# Patient Record
Sex: Female | Born: 1965 | Race: Black or African American | Hispanic: No | Marital: Married | Smoking: Current every day smoker
Health system: Southern US, Community
[De-identification: ages and names within clinical notes are randomized; demographics above are authoritative.]

## PROBLEM LIST (undated history)

## (undated) DIAGNOSIS — E785 Hyperlipidemia, unspecified: Secondary | ICD-10-CM

## (undated) DIAGNOSIS — I10 Essential (primary) hypertension: Secondary | ICD-10-CM

## (undated) DIAGNOSIS — N926 Irregular menstruation, unspecified: Secondary | ICD-10-CM

## (undated) DIAGNOSIS — K635 Polyp of colon: Secondary | ICD-10-CM

## (undated) DIAGNOSIS — O00109 Unspecified tubal pregnancy without intrauterine pregnancy: Secondary | ICD-10-CM

## (undated) DIAGNOSIS — M722 Plantar fascial fibromatosis: Secondary | ICD-10-CM

## (undated) DIAGNOSIS — E282 Polycystic ovarian syndrome: Secondary | ICD-10-CM

## (undated) HISTORY — DX: Irregular menstruation, unspecified: N92.6

## (undated) HISTORY — DX: Polycystic ovarian syndrome: E28.2

## (undated) HISTORY — DX: Polyp of colon: K63.5

## (undated) HISTORY — DX: Morbid (severe) obesity due to excess calories: E66.01

---

## 2009-04-03 LAB — HM COLONOSCOPY

## 2011-12-30 ENCOUNTER — Ambulatory Visit (INDEPENDENT_AMBULATORY_CARE_PROVIDER_SITE_OTHER): Payer: Self-pay | Admitting: Physician Assistant

## 2011-12-30 ENCOUNTER — Encounter: Payer: Self-pay | Admitting: Physician Assistant

## 2011-12-30 VITALS — BP 153/77 | HR 88 | Temp 97.9°F | Ht 66.0 in | Wt 290.0 lb

## 2011-12-30 DIAGNOSIS — I10 Essential (primary) hypertension: Secondary | ICD-10-CM

## 2011-12-30 DIAGNOSIS — J40 Bronchitis, not specified as acute or chronic: Secondary | ICD-10-CM

## 2011-12-30 MED ORDER — AZITHROMYCIN 250 MG PO TABS: ORAL_TABLET | ORAL | Status: DC

## 2011-12-30 MED ORDER — ALBUTEROL SULFATE HFA 108 (90 BASE) MCG/ACT IN AERS: 2.0000 | INHALATION_SPRAY | Freq: Four times a day (QID) | RESPIRATORY_TRACT | Status: DC | PRN

## 2011-12-30 MED ORDER — HYDROCODONE-HOMATROPINE 5-1.5 MG/5ML PO SYRP: 5.0000 mL | ORAL_SOLUTION | Freq: Every evening | ORAL | Status: DC | PRN

## 2011-12-30 NOTE — Patient Instructions (Addendum)
[facility].   Continue to monitor BP and if doesnot go down then need to follow up with Dr. Alberteen Sam.   Acute Bronchitis You have acute bronchitis. This means you have a chest cold. The airways in your lungs are red and sore (inflamed). Acute means it is sudden onset.  CAUSES Bronchitis is most often caused by the same virus that causes a cold. SYMPTOMS   Body aches.  Chest congestion.  Chills.  Cough.  Fever.  Shortness of breath.  Sore throat. TREATMENT  Acute bronchitis is usually treated with rest, fluids, and medicines for relief of fever or cough. Most symptoms should go away after a few days or a week. Increased fluids may help thin your secretions and will prevent dehydration. Your caregiver may give you an inhaler to improve your symptoms. The inhaler reduces shortness of breath and helps control cough. You can take over-the-counter pain relievers or cough medicine to decrease coughing, pain, or fever. A cool-air vaporizer may help thin bronchial secretions and make it easier to clear your chest. Antibiotics are usually not needed but can be prescribed if you smoke, are seriously ill, have chronic lung problems, are elderly, or you are at higher risk for developing complications.Allergies and asthma can make bronchitis worse. Repeated episodes of bronchitis may cause longstanding lung problems. Avoid smoking and secondhand smoke.Exposure to cigarette smoke or irritating chemicals will make bronchitis worse. If you are a cigarette smoker, consider using nicotine gum or skin patches to help control withdrawal symptoms. Quitting smoking will help your lungs heal faster. Recovery from bronchitis is often slow, but you should start feeling better after 2 to 3 days. Cough from bronchitis frequently lasts for 3 to 4 weeks. To prevent another bout of acute bronchitis:  Quit smoking.  Wash your hands frequently to get rid of viruses or use a hand sanitizer.  Avoid other people  with cold or virus symptoms.  Try not to touch your hands to your mouth, nose, or eyes. SEEK IMMEDIATE MEDICAL CARE IF:  You develop increased fever, chills, or chest pain.  You have severe shortness of breath or bloody sputum.  You develop dehydration, fainting, repeated vomiting, or a severe headache.  You have no improvement after 1 week of treatment or you get worse. MAKE SURE YOU:   Understand these instructions.  Will watch your condition.  Will get help right away if you are not doing well or get worse. Document Released: 01/28/2004 Document Revised: 03/14/2011 Document Reviewed: 04/14/2010 [facility] Patient Information 2013 [place], [place].

## 2011-12-30 NOTE — Progress Notes (Signed)
  Subjective:    Patient ID: Mindy Wallace, female    DOB: 02/23/1965, 46 y.o.   MRN: [id number]  HPI Patient reports to the clinic as a pt of Dr. Alberteen Sam who is on vacation. She has had 4 weeks of ongoing cough and SOB. She denies any fever, muscle aches, sore throat, or ear pain. She does continue to feel weak and feels more and more SOB. She has HTN and has to be careful about what she takes OTC. She has tried a safe decongestant, benadryl first and did improve:however came back. She then tried [facility] and mucinex DM. She now feels like her chest is very tight and hears herself wheezing. She does not have an inhaler.she has now started to have chills. Cough is productive.   Review of Systems     Objective:   Physical Exam  Constitutional: She is oriented to person, place, and time. She appears well-developed and well-nourished.  HENT:  Head: Normocephalic and atraumatic.  Right Ear: External ear normal.  Left Ear: External ear normal.  Mouth/Throat: Oropharynx is clear and moist.       TM's clear bilaterally.  No sinus tenderness to palpation.  Eyes: Conjunctivae normal are normal.       Clear watery discharge from both eyes.  Neck: Normal range of motion. Neck supple.  Cardiovascular: Normal rate, regular rhythm and normal heart sounds.   Pulmonary/Chest: Effort normal. She has no wheezes.       NO wheezing heard. Does sound like air movement is decreased.  Lymphadenopathy:    She has no cervical adenopathy.  Neurological: She is alert and oriented to person, place, and time.  Skin: Skin is warm and dry.  Psychiatric: She has a normal mood and affect. Her behavior is normal.          Assessment & Plan:  Bronchitis-Gave zpak to take for the next 5 days. Start using albuterol twice a day for next couple of days. Hycodan given for cough at night. Call office if not improving in next couple of days may need to add prednisone burst. Continue with mucinex and [facility] cold  care. Stay hydrated. If having to use albuterol on a regular basis then need to be seen again to discuss futher treatment.   HTN- discussed withpt importance of BP controll. Continue to monitor if not going back down then need to follow up with Dr. Alberteen Sam. ABP should go down after stop coughing.

## 2012-03-28 ENCOUNTER — Other Ambulatory Visit: Payer: Self-pay | Admitting: Family Medicine

## 2012-07-30 ENCOUNTER — Emergency Department (HOSPITAL_BASED_OUTPATIENT_CLINIC_OR_DEPARTMENT_OTHER): Payer: BC Managed Care – PPO

## 2012-07-30 ENCOUNTER — Encounter (HOSPITAL_BASED_OUTPATIENT_CLINIC_OR_DEPARTMENT_OTHER): Payer: Self-pay | Admitting: *Deleted

## 2012-07-30 ENCOUNTER — Emergency Department (HOSPITAL_BASED_OUTPATIENT_CLINIC_OR_DEPARTMENT_OTHER)
Admission: EM | Admit: 2012-07-30 | Discharge: 2012-07-30 | Disposition: A | Discharge status: Home / Self Care | Payer: BC Managed Care – PPO | Attending: Emergency Medicine | Admitting: Emergency Medicine

## 2012-07-30 DIAGNOSIS — Z860100 Personal history of colon polyps, unspecified: Secondary | ICD-10-CM | POA: Insufficient documentation

## 2012-07-30 DIAGNOSIS — F17200 Nicotine dependence, unspecified, uncomplicated: Secondary | ICD-10-CM | POA: Insufficient documentation

## 2012-07-30 DIAGNOSIS — E785 Hyperlipidemia, unspecified: Secondary | ICD-10-CM | POA: Insufficient documentation

## 2012-07-30 DIAGNOSIS — Z8739 Personal history of other diseases of the musculoskeletal system and connective tissue: Secondary | ICD-10-CM | POA: Insufficient documentation

## 2012-07-30 DIAGNOSIS — Z79899 Other long term (current) drug therapy: Secondary | ICD-10-CM | POA: Insufficient documentation

## 2012-07-30 DIAGNOSIS — Z8742 Personal history of other diseases of the female genital tract: Secondary | ICD-10-CM | POA: Insufficient documentation

## 2012-07-30 DIAGNOSIS — I10 Essential (primary) hypertension: Secondary | ICD-10-CM | POA: Insufficient documentation

## 2012-07-30 HISTORY — DX: Hyperlipidemia, unspecified: E78.5

## 2012-07-30 HISTORY — DX: Plantar fascial fibromatosis: M72.2

## 2012-07-30 HISTORY — DX: Unspecified tubal pregnancy without intrauterine pregnancy: O00.109

## 2012-07-30 HISTORY — DX: Essential (primary) hypertension: I10

## 2012-07-30 LAB — BASIC METABOLIC PANEL
GFR calc Af Amer: >90 mL/min (ref >90)
GFR calc non Af Amer: 85 mL/min — ABNORMAL LOW (ref >90)
Potassium: 3.6 mEq/L (ref 3.5–5.1)
Sodium: 142 mEq/L (ref 135–145)

## 2012-07-30 LAB — CBC
MCHC: 32.7 g/dL (ref 30.0–36.0)
Platelets: 216 10*3/uL (ref 150–400)
RDW: 16.6 % — ABNORMAL HIGH (ref 11.5–15.5)

## 2012-07-30 NOTE — ED Notes (Signed)
Pt reports numbness to left hand x 1 month. States she has noticed it is worse after drinking sparkling water or eating certain foods. No arm drift, equal grips, no facial droop or slurred speech. Denies any neck/back injury. States had hand surgery years ago.

## 2012-07-30 NOTE — ED Notes (Signed)
Patient transported to CT 

## 2012-07-30 NOTE — ED Notes (Addendum)
Pt c/o left arm " tingling" x 1 month , seen by PMD for same

## 2012-07-30 NOTE — ED Provider Notes (Signed)
CSN: [id number]     Arrival date & time 07/30/12  1803 History  This chart was scribed for Mindy Chick, MD by Bennett Scrape, ED Scribe. This patient was seen in room MH08/MH08 and the patient's care was started at 7:20 PM.   First MD Initiated Contact with Patient 07/30/12 1905     Chief Complaint  Patient presents with  . Numbness    Patient is a 47 y.o. female presenting with general illness. The history is provided by the patient. No language interpreter was used.  Illness Location:  Left arm Quality:  Numbness Duration:  5 weeks Timing:  Intermittent Chronicity:  New Associated symptoms: no headaches   Risk factors:  None   HPI Comments: Ashlynn Gunnels is a 47 y.o. female who presents to the Emergency Department complaining of 4 to 5 weeks of intermittent paresthesias described as needles in the left hand that radiates up into the left shoulder. The symptoms originally started in the left forearm but now begin gradually in all 5 left fingers and gradually radiate up into the left shoulder.  She denies having the numbness currently. She denies having any known modifying factors or triggers. She reports that she called her PCP to schedule an appointment and was told to come to the ED for evaluation. She reports prior surgery in the left hand from finger fractures 20 years ago but denies having on-going complications. She denies any neck pain or prior neck injuries. She denies HA, visual disturbance and weakness as associated symptoms. She has a h/o HTN but denies any h/o DM.  She is currently on Coreg for "heart problems".  PCP is Dr. Alberteen Sam   Past Medical History  Diagnosis Date  . Hypertension   . Hyperlipemia   . Tubal pregnancy   . Plantar fasciitis   . Irregular periods   . PCOS (polycystic ovarian syndrome)   . Colon polyps    History reviewed. No pertinent past surgical history.  Family History  Problem Relation Age of Onset  . Diabetes Mother   .  Hypertension Mother   . Diabetes Father   . Cancer Paternal Aunt     Breast Cancer  . Diabetes Maternal Grandmother   . Diabetes Paternal Grandmother   . Alzheimer's disease Paternal Grandmother   . Cancer Paternal Aunt     Breast Cancer    History  Substance Use Topics  . Smoking status: Current Every Day Smoker -- 0.30 packs/day    Types: Cigarettes  . Smokeless tobacco: Not on file  . Alcohol Use: Yes   No OB history provided.  Review of Systems  HENT: Negative for neck pain.   Eyes: Negative for visual disturbance.  Neurological: Positive for numbness. Negative for weakness and headaches.  All other systems reviewed and are negative.    Allergies  Review of patient's allergies indicates no known allergies.  Home Medications   Current Outpatient Rx  Name  Route  Sig  Dispense  Refill  . albuterol (PROVENTIL HFA;VENTOLIN HFA) 108 (90 BASE) MCG/ACT inhaler   Inhalation   Inhale 2 puffs into the lungs every 6 (six) hours as needed for wheezing.   1 Inhaler   1   . amLODipine-valsartan (EXFORGE) 10-320 MG per tablet   Oral   Take 1 tablet by mouth daily.         . carvedilol (COREG) 12.5 MG tablet      TAKE 1 TABLET BY MOUTH TWICE DAILY   90 tablet  0     Please inform pt she was only given 30 days.  She  ...   . Cholecalciferol (VITAMIN D) 2000 UNITS CAPS   Oral   Take by mouth.         . fluconazole (DIFLUCAN) 150 MG tablet   Oral   Take 150 mg by mouth once.         . furosemide (LASIX) 40 MG tablet   Oral   Take 40 mg by mouth daily.         . naproxen sodium (ANAPROX) 550 MG tablet   Oral   Take 550 mg by mouth 2 (two) times daily with a meal.         . simvastatin (ZOCOR) 20 MG tablet   Oral   Take 20 mg by mouth every evening.          Triage Vitals: BP 133/76  Pulse 83  Temp(Src) 98.5 F (36.9 C) (Oral)  Resp 16  Ht 5\' 7"  (1.702 m)  Wt 297 lb (134.718 kg)  BMI 46.51 kg/m2  SpO2 97%  Physical Exam  Nursing note  and vitals reviewed. Constitutional: She is oriented to person, place, and time. She appears well-developed and well-nourished. No distress.  HENT:  Head: Normocephalic and atraumatic.  Eyes: Conjunctivae and EOM are normal. Pupils are equal, round, and reactive to light.  Neck: Normal range of motion. Neck supple. No tracheal deviation present.  Cardiovascular: Normal rate, regular rhythm and normal heart sounds.   No murmur heard. Pulmonary/Chest: Effort normal and breath sounds normal. No respiratory distress. She has no wheezes. She has no rales.  Abdominal: Soft. Bowel sounds are normal. There is no tenderness.  Musculoskeletal: Normal range of motion.  Neurological: She is alert and oriented to person, place, and time. No cranial nerve deficit.  Sensation and strength are intact throughout, CN 2 through 12 intact, radial, ulnar and median nerve testing of LUE are normal  Skin: Skin is warm and dry.  Psychiatric: She has a normal mood and affect. Her behavior is normal.    ED Course   Procedures (including critical care time)  DIAGNOSTIC STUDIES: Oxygen Saturation is 97% on room air, normal by my interpretation.    COORDINATION OF CARE: 7:22 PM-Advised pt that her symptoms are mostly likely not TIA or CVA related. Discussed treatment plan which includes CT of head, EKG, CBC panel and BMP with pt at bedside and pt agreed to plan.    Date: 07/30/2012  Rate: 65  Rhythm: normal sinus rhythm  QRS Axis: normal  Intervals: normal  ST/T Wave abnormalities: normal  Conduction Disutrbances: none  Narrative Interpretation: unremarkable     Labs Reviewed  CBC - Abnormal; Notable for the following:    MCV 73.0 (*)    MCH 23.9 (*)    RDW 16.6 (*)    All other components within normal limits  BASIC METABOLIC PANEL - Abnormal; Notable for the following:    Glucose, Bld 103 (*)    GFR calc non Af Amer 85 (*)    All other components within normal limits   Ct Head Wo  Contrast  07/30/2012   *RADIOLOGY REPORT*  Clinical Data: Intermittent left arm numbness.  CT HEAD WITHOUT CONTRAST  Technique:  Contiguous axial images were obtained from the base of the skull through the vertex without contrast.  Comparison: None.  Findings: The brain demonstrates no evidence of hemorrhage, infarction, edema, mass effect, extra-axial fluid collection, hydrocephalus or mass  lesion.  The skull is unremarkable.  IMPRESSION: Normal head CT.   Original Report Authenticated By: Irish Lack, M.D.   1. parasthesias  MDM  Pt presents with c/o intermittent numbness and tingling of left fingers and arm.  She states symptoms have been ongoing for several weeks. Normal neuro exam in the ED today.  She denies current symptoms.  I doubt this represents CVA, especially with normal CT after symptoms for weeks.  Also intermittent nature seems less likely.  Labs reassuring. Pt was advised f/u with her PMD as well as given follow up info for [facility].  Discharged with strict return precautions.  Pt agreeable with plan.  I personally performed the services described in this documentation, which was scribed in my presence. The recorded information has been reviewed and is accurate.    Mindy Chick, MD 07/31/12 1515

## 2012-07-31 ENCOUNTER — Ambulatory Visit: Payer: Self-pay | Admitting: Family Medicine

## 2012-08-14 ENCOUNTER — Encounter: Payer: Self-pay | Admitting: Family Medicine

## 2012-08-14 ENCOUNTER — Ambulatory Visit (INDEPENDENT_AMBULATORY_CARE_PROVIDER_SITE_OTHER): Payer: BC Managed Care – PPO | Admitting: Family Medicine

## 2012-08-14 VITALS — BP 130/90 | HR 71 | Resp 16 | Ht 65.0 in | Wt 299.0 lb

## 2012-08-14 DIAGNOSIS — R5381 Other malaise: Secondary | ICD-10-CM

## 2012-08-14 DIAGNOSIS — R232 Flushing: Secondary | ICD-10-CM

## 2012-08-14 DIAGNOSIS — I10 Essential (primary) hypertension: Secondary | ICD-10-CM

## 2012-08-14 DIAGNOSIS — M7989 Other specified soft tissue disorders: Secondary | ICD-10-CM

## 2012-08-14 DIAGNOSIS — E785 Hyperlipidemia, unspecified: Secondary | ICD-10-CM

## 2012-08-14 DIAGNOSIS — Z5181 Encounter for therapeutic drug level monitoring: Secondary | ICD-10-CM

## 2012-08-14 DIAGNOSIS — R6882 Decreased libido: Secondary | ICD-10-CM

## 2012-08-14 DIAGNOSIS — R5383 Other fatigue: Secondary | ICD-10-CM

## 2012-08-14 LAB — LIPID PANEL
Cholesterol: 165 mg/dL (ref 0–200)
HDL: 42 mg/dL (ref >39)
LDL Cholesterol: 103 mg/dL — ABNORMAL HIGH (ref 0–99)
Total CHOL/HDL Ratio: 3.9 Ratio
Triglycerides: 99 mg/dL (ref <150)
VLDL: 20 mg/dL (ref 0–40)

## 2012-08-14 LAB — HEPATIC FUNCTION PANEL
ALT: 20 U/L (ref 0–35)
AST: 18 U/L (ref 0–37)
Albumin: 3.9 g/dL (ref 3.5–5.2)
Alkaline Phosphatase: 50 U/L (ref 39–117)
Bilirubin, Direct: 0.1 mg/dL (ref 0.0–0.3)
Indirect Bilirubin: 0.3 mg/dL (ref 0.0–0.9)
Total Bilirubin: 0.4 mg/dL (ref 0.3–1.2)
Total Protein: 6.7 g/dL (ref 6.0–8.3)

## 2012-08-14 MED ORDER — FUROSEMIDE 40 MG PO TABS: 80.0000 mg | ORAL_TABLET | Freq: Every day | ORAL | Status: AC

## 2012-08-14 MED ORDER — CARVEDILOL 12.5 MG PO TABS: 12.5000 mg | ORAL_TABLET | Freq: Two times a day (BID) | ORAL | Status: DC

## 2012-08-14 MED ORDER — AMLODIPINE BESYLATE-VALSARTAN 10-320 MG PO TABS: 1.0000 | ORAL_TABLET | Freq: Every day | ORAL | Status: DC

## 2012-08-14 MED ORDER — SIMVASTATIN 80 MG PO TABS: 80.0000 mg | ORAL_TABLET | Freq: Every evening | ORAL | Status: DC

## 2012-08-14 NOTE — Patient Instructions (Addendum)
1)  Menopause: We're checking a lab to confirm.  You can try some Estrovan for your symptoms.  You need to get a mammogram before we can consider hormone replacement.    Menopause and Herbal Products Menopause is the normal time of life when menstrual periods stop completely. Menopause is complete when you have missed 12 consecutive menstrual periods. It usually occurs between the ages of 27 to 1, with an average age of 45. Very rarely does a woman develop menopause before 49 years old. At menopause, your ovaries stop producing the female hormones, estrogen and progesterone. This can cause undesirable symptoms and also affect your health. Sometimes the symptoms can occur 4 to 5 years before the menopause begins. There is no relationship between menopause and:  Oral contraceptives.  Number of children you had.  Race.  The age your menstrual periods started (menarche). Heavy smokers and very thin women may develop menopause earlier in life. Estrogen and progesterone hormone treatment is the usual method of treating menopausal symptoms. However, there are women who should not take hormone treatment. This is true of:   Women that have breast or uterine cancer.  Women who prefer not to take hormones because of certain side effects (abnormal uterine bleeding).  Women who are afraid that hormones may cause breast cancer.  Women who have a history of liver disease, heart disease, stroke, or blood clots. For these women, there are other medications that may help treat their menopausal symptoms. These medications are found in plants and botanical products. They can be found in the form of herbs, teas, oils, tinctures, and pills.  CAUSES:  The ovaries stop producing the female hormones estrogen and progesterone.  Other causes include:  Surgery to remove both ovaries.  The ovaries stop functioning for no know reason.  Tumors of the pituitary gland in the brain.  Medical disease that affects  the ovaries and hormone production.  Radiation treatment to the abdomen or pelvis.  Chemotherapy that affects the ovaries. PHYTOESTROGENS: Phytoestrogens occur naturally in plants and plant products. They act like estrogen in the body. Herbal medications are made from these plants and botanical steroids. There are 3 types of phytoestrogens:  Isoflavones (genistein and daidzein) are found in soy, garbanzo beans, miso and tofu foods.  Ligins are found in the shell of seeds. They are used to make oils like flaxseed oil. The bacteria in your intestine act on these foods to produce the estrogen-like hormones.  Coumestans are estrogen-like. Some of the foods they are found in include sunflower seeds and bean sprouts. CONDITIONS AND THEIR POSSIBLE HERBAL TREATMENT:  Hot flashes and night sweats.  Soy, black cohosh and evening primrose.  Irritability, insomnia, depression and memory problems.  Chasteberry, ginseng, and soy.  St. John's wort may be helpful for depression. However, there is a concern of it causing cataracts of the eye and may have bad effects on other medications. St. John's wort should not be taken for long time and without your caregiver's advice.  Loss of libido and vaginal and skin dryness.  Wild yam and soy.  Prevention of coronary heart disease and osteoporosis.  Soy and Isoflavones. Several studies have shown that some women benefit from herbal medications, but most of the studies have not consistently shown that these supplements are much better than placebo. Other forms of treatment to help women with menopausal symptoms include a balanced diet, rest, exercise, vitamin and calcium (with vitamin D) supplements, acupuncture, and group therapy when necessary. THOSE WHO SHOULD  NOT TAKE HERBAL MEDICATIONS INCLUDE:  Women who are planning on getting pregnant unless told by your caregiver.  Women who are breastfeeding unless told by your caregiver.  Women who are  taking other prescription medications unless told by your caregiver.  Infants, children, and elderly women unless told by your caregiver. Different herbal medications have different and unmeasured amounts of the herbal ingredients. There are no regulations, quality control, and standardization of the ingredients in herbal medications. Therefore, the amount of the ingredient in the medication may vary from one herb, pill, tea, oil or tincture to another. Many herbal medications can cause serious problems and can even have poisonous effects if taken too much or too long. If problems develop, the medication should be stopped and recorded by your caregiver. HOME CARE INSTRUCTIONS  Do not take or give children herbal medications without your caregiver's advice.  Let your caregiver know all the medications you are taking. This includes prescription, over-the-counter, eye drops, and creams.  Do not take herbal medications longer or more than recommended.  Tell your caregiver about any side effects from the medication. SEEK MEDICAL CARE IF:  You develop a fever of 102 F (38.9 C), or as directed by your caregiver.  You feel sick to your stomach (nauseous), vomit, or have diarrhea.  You develop a rash.  You develop abdominal pain.  You develop severe headaches.  You start to have vision problems.  You feel dizzy or faint.  You start to feel numbness in any part of your body.  You start shaking (have convulsions). Document Released: 06/08/2007 Document Revised: 12/07/2011 Document Reviewed: 01/05/2010 [facility] Patient Information 2014 [place], [place].   Menopause Menopause is the normal time of life when menstrual periods stop completely. Menopause is complete when you have missed 12 consecutive menstrual periods. It usually occurs between the ages of 46 to 70, with an average age of 60. Very rarely does a woman develop menopause before 49 years old. At menopause, your ovaries stop  producing the female hormones, estrogen and progesterone. This can cause undesirable symptoms and also affect your health. Sometimes the symptoms may occur 4 to 5 years before the menopause begins. There is no relationship between menopause and:  Oral contraceptives.  Number of children you had.  Race.  The age your menstrual periods started (menarche). Heavy smokers and very thin women may develop menopause earlier in life. CAUSES  The ovaries stop producing the female hormones estrogen and progesterone.  Other causes include:  Surgery to remove both ovaries.  The ovaries stop functioning for no known reason.  Tumors of the pituitary gland in the brain.  Medical disease that affects the ovaries and hormone production.  Radiation treatment to the abdomen or pelvis.  Chemotherapy that affects the ovaries. SYMPTOMS   Hot flashes.  Night sweats.  Decrease in sex drive.  Vaginal dryness and thinning of the vagina causing painful intercourse.  Dryness of the skin and developing wrinkles.  Headaches.  Tiredness.  Irritability.  Memory problems.  Weight gain.  Bladder infections.  Hair growth of the face and chest.  Infertility. More serious symptoms include:  Loss of bone (osteoporosis) causing breaks (fractures).  Depression.  Hardening and narrowing of the arteries (atherosclerosis) causing heart attacks and strokes. DIAGNOSIS   When the menstrual periods have stopped for 12 straight months.  Physical exam.  Hormone studies of the blood. TREATMENT  There are many treatment choices and nearly as many questions about them. The decisions to treat or not  to treat menopausal changes is an individual choice made with your caregiver. Your caregiver can discuss the treatments with you. Together, you can decide which treatment will work best for you. Your treatment choices may include:   Hormone therapy (estorgen and progesterone).  Non-hormonal  medications.  Treating the individual symptoms with medication (for example antidepressants for depression).  Herbal medications that may help specific symptoms.  Counseling by a psychiatrist or psychologist.  Group therapy.  Lifestyle changes including:  Eating healthy.  Regular exercise.  Limiting caffeine and alcohol.  Stress management and meditation.  No treatment. HOME CARE INSTRUCTIONS   Take the medication your caregiver gives you as directed.  Get plenty of sleep and rest.  Exercise regularly.  Eat a diet that contains calcium (good for the bones) and soy products (acts like estrogen hormone).  Avoid alcoholic beverages.  Do not smoke.  If you have hot flashes, dress in layers.  Take supplements, calcium and vitamin D to strengthen bones.  You can use over-the-counter lubricants or moisturizers for vaginal dryness.  Group therapy is sometimes very helpful.  Acupuncture may be helpful in some cases. SEEK MEDICAL CARE IF:   You are not sure you are in menopause.  You are having menopausal symptoms and need advice and treatment.  You are still having menstrual periods after age 50.  You have pain with intercourse.  Menopause is complete (no menstrual period for 12 months) and you develop vaginal bleeding.  You need a referral to a specialist (gynecologist, psychiatrist or psychologist) for treatment. SEEK IMMEDIATE MEDICAL CARE IF:   You have severe depression.  You have excessive vaginal bleeding.  You fell and think you have a broken bone.  You have pain when you urinate.  You develop leg or chest pain.  You have a fast pounding heart beat (palpitations).  You have severe headaches.  You develop vision problems.  You feel a lump in your breast.  You have abdominal pain or severe indigestion. Document Released: 03/12/2003 Document Revised: 03/14/2011 Document Reviewed: 10/18/2007 [facility] Patient Information 2014 [place],  [place].

## 2012-08-14 NOTE — Progress Notes (Signed)
  Subjective:    Patient ID: Mindy Wallace, female    DOB: 30-Oct-1965, 47 y.o.   MRN: [id number]  HPI  Maloni is here today for medication refills and to discuss the conditions listed below:    1)  Obesity:  She wants to lose weight to improve her quality of life.  She has found a product that is called "Gastric Bypass Alternative". She would like to know if she can take it.    2)  Hypertension:  She is on Coreg and Exforge and has Wallace well with both.    3)  Hyperlipidemia:  She continues taking her simvastatin and needs a refill on it.   4)  Menopause:  She has not had a period in over 12 months.  She feels that her sex drive has decreased.  She also has been experiencing night sweats.    Review of Systems  Constitutional: Negative for activity change, fatigue and unexpected weight change.  HENT: Negative.   Eyes: Negative.   Respiratory: Negative for shortness of breath.   Cardiovascular: Negative for chest pain, palpitations and leg swelling.  Gastrointestinal: Negative for diarrhea and constipation.  Endocrine: Negative.        Night sweats   Genitourinary: Negative for difficulty urinating.  Musculoskeletal: Negative.   Skin: Negative.   Neurological: Negative.   Hematological: Negative for adenopathy. Does not bruise/bleed easily.  Psychiatric/Behavioral: Negative for sleep disturbance and dysphoric mood. The patient is not nervous/anxious.        Low sex drive    Past Medical History  Diagnosis Date  . Hypertension   . Hyperlipemia   . Tubal pregnancy   . Plantar fasciitis   . Irregular periods   . PCOS (polycystic ovarian syndrome)   . Colon polyps     Family History  Problem Relation Age of Onset  . Diabetes Mother   . Hypertension Mother   . Diabetes Father   . Cancer Paternal Aunt     Breast Cancer  . Diabetes Maternal Grandmother   . Diabetes Paternal Grandmother   . Alzheimer's disease Paternal Grandmother   . Cancer Paternal Aunt     Breast  Cancer      Objective:   Physical Exam  Vitals reviewed. Constitutional: She is oriented to person, place, and time.  Eyes: Conjunctivae are normal. No scleral icterus.  Neck: Neck supple. No thyromegaly present.  Cardiovascular: Normal rate, regular rhythm and normal heart sounds.   Pulmonary/Chest: Effort normal and breath sounds normal.  Musculoskeletal: She exhibits no edema and no tenderness.  Lymphadenopathy:    She has no cervical adenopathy.  Neurological: She is alert and oriented to person, place, and time.  Skin: Skin is warm and dry.  Psychiatric: She has a normal mood and affect. Her behavior is normal. Judgment and thought content normal.     Assessment & Plan:

## 2012-08-15 ENCOUNTER — Telehealth: Payer: Self-pay | Admitting: *Deleted

## 2012-08-15 ENCOUNTER — Encounter: Payer: Self-pay | Admitting: *Deleted

## 2012-08-15 LAB — FOLLICLE STIMULATING HORMONE: FSH: 34.8 m[IU]/mL

## 2012-08-15 LAB — TESTOSTERONE: Testosterone: 42 ng/dL (ref 10–70)

## 2012-08-15 LAB — TSH: TSH: 1.021 u[IU]/mL (ref 0.350–4.500)

## 2012-08-15 NOTE — Telephone Encounter (Signed)
Her Exforge was approved until 2039. Her Pharmacy was notified. PG

## 2012-09-12 ENCOUNTER — Encounter: Payer: Self-pay | Admitting: Family Medicine

## 2012-09-12 DIAGNOSIS — R6882 Decreased libido: Secondary | ICD-10-CM | POA: Insufficient documentation

## 2012-09-12 DIAGNOSIS — R5381 Other malaise: Secondary | ICD-10-CM | POA: Insufficient documentation

## 2012-09-12 DIAGNOSIS — M7989 Other specified soft tissue disorders: Secondary | ICD-10-CM | POA: Insufficient documentation

## 2012-09-12 DIAGNOSIS — R232 Flushing: Secondary | ICD-10-CM | POA: Insufficient documentation

## 2012-09-12 DIAGNOSIS — I10 Essential (primary) hypertension: Secondary | ICD-10-CM | POA: Insufficient documentation

## 2012-09-12 DIAGNOSIS — Z5181 Encounter for therapeutic drug level monitoring: Secondary | ICD-10-CM | POA: Insufficient documentation

## 2012-09-12 DIAGNOSIS — E785 Hyperlipidemia, unspecified: Secondary | ICD-10-CM | POA: Insufficient documentation

## 2012-09-12 NOTE — Assessment & Plan Note (Signed)
Checking her testosterone level.

## 2012-09-12 NOTE — Assessment & Plan Note (Signed)
Checking a FSH level to see if she is truly in menopause .

## 2012-09-12 NOTE — Assessment & Plan Note (Signed)
We discussed the various surgeries for weight loss.

## 2012-09-12 NOTE — Assessment & Plan Note (Addendum)
Refilled her Exforge and Coreg.

## 2012-09-12 NOTE — Assessment & Plan Note (Addendum)
Refilled her simvastatin and checking her lipid panel.

## 2012-09-12 NOTE — Assessment & Plan Note (Signed)
Checking a CBC and TSH.   

## 2012-09-12 NOTE — Assessment & Plan Note (Signed)
Refilled her Lasix.   

## 2012-09-13 ENCOUNTER — Ambulatory Visit: Payer: BC Managed Care – PPO | Admitting: Family Medicine

## 2012-09-21 ENCOUNTER — Other Ambulatory Visit (HOSPITAL_COMMUNITY)
Admission: RE | Admit: 2012-09-21 | Discharge: 2012-09-21 | Disposition: A | Discharge status: Home / Self Care | Payer: BC Managed Care – PPO | Source: Ambulatory Visit | Attending: Family Medicine | Admitting: Family Medicine

## 2012-09-21 ENCOUNTER — Ambulatory Visit (INDEPENDENT_AMBULATORY_CARE_PROVIDER_SITE_OTHER): Payer: BC Managed Care – PPO | Admitting: Family Medicine

## 2012-09-21 ENCOUNTER — Encounter: Payer: Self-pay | Admitting: Family Medicine

## 2012-09-21 VITALS — BP 135/77 | HR 84 | Resp 16 | Ht 65.0 in | Wt 294.0 lb

## 2012-09-21 DIAGNOSIS — Z1151 Encounter for screening for human papillomavirus (HPV): Secondary | ICD-10-CM | POA: Insufficient documentation

## 2012-09-21 DIAGNOSIS — Z124 Encounter for screening for malignant neoplasm of cervix: Secondary | ICD-10-CM

## 2012-09-21 DIAGNOSIS — Z01419 Encounter for gynecological examination (general) (routine) without abnormal findings: Secondary | ICD-10-CM | POA: Insufficient documentation

## 2012-09-21 DIAGNOSIS — Z0000 Encounter for general adult medical examination without abnormal findings: Secondary | ICD-10-CM

## 2012-09-21 LAB — POCT URINALYSIS DIPSTICK
Bilirubin, UA: NEGATIVE
Blood, UA: NEGATIVE
Glucose, UA: NEGATIVE
Ketones, UA: NEGATIVE
Leukocytes, UA: NEGATIVE
Nitrite, UA: NEGATIVE
Protein, UA: NEGATIVE
Spec Grav, UA: 1.01
Urobilinogen, UA: NEGATIVE
pH, UA: 5

## 2012-09-21 MED ORDER — FLUCONAZOLE 150 MG PO TABS: 150.0000 mg | ORAL_TABLET | Freq: Once | ORAL | Status: DC

## 2012-09-21 MED ORDER — DOXYCYCLINE HYCLATE 50 MG PO CAPS: 100.0000 mg | ORAL_CAPSULE | Freq: Two times a day (BID) | ORAL | Status: DC

## 2012-09-21 MED ORDER — RIFAMPIN 300 MG PO CAPS: 600.0000 mg | ORAL_CAPSULE | Freq: Every day | ORAL | Status: AC

## 2012-09-21 MED ORDER — TERCONAZOLE 0.8 % VA CREA: 1.0000 | TOPICAL_CREAM | Freq: Every day | VAGINAL | Status: DC

## 2012-09-21 MED ORDER — MUPIROCIN 2 % EX OINT: TOPICAL_OINTMENT | CUTANEOUS | Status: DC

## 2012-09-21 NOTE — Progress Notes (Signed)
Subjective:    Patient ID: Mindy Wallace, female    DOB: 1965-09-04, 47 y.o.   MRN: [id number]  HPI  Mindy Wallace is here today for her annual CPE with PAP smear.  She has done well since her last office visit.     Review of Systems  Constitutional: Negative.   HENT: Negative.   Eyes: Negative.   Respiratory: Negative.   Cardiovascular: Negative.   Gastrointestinal: Negative.   Endocrine: Negative.   Genitourinary: Negative.   Musculoskeletal: Negative.   Skin: Negative.   Allergic/Immunologic: Negative.   Neurological: Negative.   Hematological: Negative.   Psychiatric/Behavioral: Negative.      Past Medical History  Diagnosis Date  . Hypertension   . Hyperlipemia   . Tubal pregnancy   . Plantar fasciitis   . Irregular periods   . PCOS (polycystic ovarian syndrome)   . Colon polyps   . Morbid obesity      Family History  Problem Relation Age of Onset  . Diabetes Mother   . Hypertension Mother   . Diabetes Father   . Cancer Paternal Aunt     Breast Cancer  . Diabetes Maternal Grandmother   . Diabetes Paternal Grandmother   . Alzheimer's disease Paternal Grandmother   . Cancer Paternal Aunt     Breast Cancer     History   Social History Narrative   Marital Status: Married Librarian, academic)   Children:  G3 P1021 (2 Tubal Pregnancy)   Pets:  None    Living Situation: Lives with spouse   Occupation: Animal nutritionist)   Education:  Engineer, agricultural    Tobacco Use/Exposure: 5-6 cigarettes/day    Alcohol Use:  Occasional   Drug Use:  None   Diet:  Regular   Exercise:  Limited    Hobbies: Hair, Taking care of people               Objective:   Physical Exam  Vitals reviewed. Constitutional: She is oriented to person, place, and time. She appears well-developed and well-nourished.  HENT:  Head: Normocephalic and atraumatic.  Right Ear: External ear normal.  Left Ear: External ear normal.  Nose: Nose normal.  Mouth/Throat:  Oropharynx is clear and moist.  Eyes: Conjunctivae and EOM are normal. Pupils are equal, round, and reactive to light.  Neck: Normal range of motion. No thyromegaly present.  Cardiovascular: Normal rate, regular rhythm, normal heart sounds and intact distal pulses.  Exam reveals no gallop and no friction rub.   No murmur heard. Pulmonary/Chest: Effort normal and breath sounds normal. Right breast exhibits no inverted nipple, no mass, no nipple discharge, no skin change and no tenderness. Left breast exhibits no inverted nipple, no mass, no nipple discharge, no skin change and no tenderness. Breasts are symmetrical.    Abdominal: Soft. Bowel sounds are normal. Hernia confirmed negative in the right inguinal area and confirmed negative in the left inguinal area.  Genitourinary: Vagina normal and uterus normal. Pelvic exam was performed with patient supine. There is no rash, tenderness or lesion on the right labia. There is no rash, tenderness or lesion on the left labia. No vaginal discharge found.  Musculoskeletal: Normal range of motion. She exhibits no edema and no tenderness.  Lymphadenopathy:    She has no cervical adenopathy.       Right: No inguinal adenopathy present.       Left: No inguinal adenopathy present.  Neurological: She is alert and oriented to person, place, and time.  She has normal reflexes.  Skin: Skin is warm and dry.     Tattoos (3)   Psychiatric: She has a normal mood and affect. Her behavior is normal. Judgment and thought content normal.      Assessment & Plan:

## 2012-09-21 NOTE — Patient Instructions (Addendum)
Preventive Care for Adults, Female A healthy lifestyle and preventive care can promote health and wellness. Preventive health guidelines for women include the following key practices.  A routine yearly physical is a good way to check with your caregiver about your health and preventive screening. It is a chance to share any concerns and updates on your health, and to receive a thorough exam.  Visit your dentist for a routine exam and preventive care every 6 months. Brush your teeth twice a day and floss once a day. Good oral hygiene prevents tooth decay and gum disease.  The frequency of eye exams is based on your age, health, family medical history, use of contact lenses, and other factors. Follow your caregiver's recommendations for frequency of eye exams.  Eat a healthy diet. Foods like vegetables, fruits, whole grains, low-fat dairy products, and lean protein foods contain the nutrients you need without too many calories. Decrease your intake of foods high in solid fats, added sugars, and salt. Eat the right amount of calories for you.Get information about a proper diet from your caregiver, if necessary.  Regular physical exercise is one of the most important things you can do for your health. Most adults should get at least 150 minutes of moderate-intensity exercise (any activity that increases your heart rate and causes you to sweat) each week. In addition, most adults need muscle-strengthening exercises on 2 or more days a week.  Maintain a healthy weight. The body mass index (BMI) is a screening tool to identify possible weight problems. It provides an estimate of body fat based on height and weight. Your caregiver can help determine your BMI, and can help you achieve or maintain a healthy weight.For adults 20 years and older:  A BMI below 18.5 is considered underweight.  A BMI of 18.5 to 24.9 is normal.  A BMI of 25 to 29.9 is considered overweight.  A BMI of 30 and above is  considered obese.  Maintain normal blood lipids and cholesterol levels by exercising and minimizing your intake of saturated fat. Eat a balanced diet with plenty of fruit and vegetables. Blood tests for lipids and cholesterol should begin at age 20 and be repeated every 5 years. If your lipid or cholesterol levels are high, you are over 50, or you are at high risk for heart disease, you may need your cholesterol levels checked more frequently.Ongoing high lipid and cholesterol levels should be treated with medicines if diet and exercise are not effective.  If you smoke, find out from your caregiver how to quit. If you do not use tobacco, do not start.  If you are pregnant, do not drink alcohol. If you are breastfeeding, be very cautious about drinking alcohol. If you are not pregnant and choose to drink alcohol, do not exceed 1 drink per day. One drink is considered to be 12 ounces (355 mL) of beer, 5 ounces (148 mL) of wine, or 1.5 ounces (44 mL) of liquor.  Avoid use of street drugs. Do not share needles with anyone. Ask for help if you need support or instructions about stopping the use of drugs.  High blood pressure causes heart disease and increases the risk of stroke. Your blood pressure should be checked at least every 1 to 2 years. Ongoing high blood pressure should be treated with medicines if weight loss and exercise are not effective.  If you are 55 to 49 years old, ask your caregiver if you should take aspirin to prevent strokes.  Diabetes   screening involves taking a blood sample to check your fasting blood sugar level. This should be done once every 3 years, after age 45, if you are within normal weight and without risk factors for diabetes. Testing should be considered at a younger age or be carried out more frequently if you are overweight and have at least 1 risk factor for diabetes.  Breast cancer screening is essential preventive care for women. You should practice "breast  self-awareness." This means understanding the normal appearance and feel of your breasts and may include breast self-examination. Any changes detected, no matter how small, should be reported to a caregiver. Women in their 20s and 30s should have a clinical breast exam (CBE) by a caregiver as part of a regular health exam every 1 to 3 years. After age 40, women should have a CBE every year. Starting at age 40, women should consider having a mammography (breast X-ray test) every year. Women who have a family history of breast cancer should talk to their caregiver about genetic screening. Women at a high risk of breast cancer should talk to their caregivers about having magnetic resonance imaging (MRI) and a mammography every year.  The Pap test is a screening test for cervical cancer. A Pap test can show cell changes on the cervix that might become cervical cancer if left untreated. A Pap test is a procedure in which cells are obtained and examined from the lower end of the uterus (cervix).  Women should have a Pap test starting at age 21.  Between ages 21 and 29, Pap tests should be repeated every 2 years.  Beginning at age 30, you should have a Pap test every 3 years as long as the past 3 Pap tests have been normal.  Some women have medical problems that increase the chance of getting cervical cancer. Talk to your caregiver about these problems. It is especially important to talk to your caregiver if a new problem develops soon after your last Pap test. In these cases, your caregiver may recommend more frequent screening and Pap tests.  The above recommendations are the same for women who have or have not gotten the vaccine for human papillomavirus (HPV).  If you had a hysterectomy for a problem that was not cancer or a condition that could lead to cancer, then you no longer need Pap tests. Even if you no longer need a Pap test, a regular exam is a good idea to make sure no other problems are  starting.  If you are between ages 65 and 70, and you have had normal Pap tests going back 10 years, you no longer need Pap tests. Even if you no longer need a Pap test, a regular exam is a good idea to make sure no other problems are starting.  If you have had past treatment for cervical cancer or a condition that could lead to cancer, you need Pap tests and screening for cancer for at least 20 years after your treatment.  If Pap tests have been discontinued, risk factors (such as a new sexual partner) need to be reassessed to determine if screening should be resumed.  The HPV test is an additional test that may be used for cervical cancer screening. The HPV test looks for the virus that can cause the cell changes on the cervix. The cells collected during the Pap test can be tested for HPV. The HPV test could be used to screen women aged 30 years and older, and should   be used in women of any age who have unclear Pap test results. After the age of 30, women should have HPV testing at the same frequency as a Pap test.  Colorectal cancer can be detected and often prevented. Most routine colorectal cancer screening begins at the age of 50 and continues through age 75. However, your caregiver may recommend screening at an earlier age if you have risk factors for colon cancer. On a yearly basis, your caregiver may provide home test kits to check for hidden blood in the stool. Use of a small camera at the end of a tube, to directly examine the colon (sigmoidoscopy or colonoscopy), can detect the earliest forms of colorectal cancer. Talk to your caregiver about this at age 50, when routine screening begins. Direct examination of the colon should be repeated every 5 to 10 years through age 75, unless early forms of pre-cancerous polyps or small growths are found.  Hepatitis C blood testing is recommended for all people born from 1945 through 1965 and any individual with known risks for hepatitis C.  Practice  safe sex. Use condoms and avoid high-risk sexual practices to reduce the spread of sexually transmitted infections (STIs). STIs include gonorrhea, chlamydia, syphilis, trichomonas, herpes, HPV, and human immunodeficiency virus (HIV). Herpes, HIV, and HPV are viral illnesses that have no cure. They can result in disability, cancer, and death. Sexually active women aged 25 and younger should be checked for chlamydia. Older women with new or multiple partners should also be tested for chlamydia. Testing for other STIs is recommended if you are sexually active and at increased risk.  Osteoporosis is a disease in which the bones lose minerals and strength with aging. This can result in serious bone fractures. The risk of osteoporosis can be identified using a bone density scan. Women ages 65 and over and women at risk for fractures or osteoporosis should discuss screening with their caregivers. Ask your caregiver whether you should take a calcium supplement or vitamin D to reduce the rate of osteoporosis.  Menopause can be associated with physical symptoms and risks. Hormone replacement therapy is available to decrease symptoms and risks. You should talk to your caregiver about whether hormone replacement therapy is right for you.  Use sunscreen with sun protection factor (SPF) of 30 or more. Apply sunscreen liberally and repeatedly throughout the day. You should seek shade when your shadow is shorter than you. Protect yourself by wearing long sleeves, pants, a wide-brimmed hat, and sunglasses year round, whenever you are outdoors.  Once a month, do a whole body skin exam, using a mirror to look at the skin on your back. Notify your caregiver of new moles, moles that have irregular borders, moles that are larger than a pencil eraser, or moles that have changed in shape or color.  Stay current with required immunizations.  Influenza. You need a dose every fall (or winter). The composition of the flu vaccine  changes each year, so being vaccinated once is not enough.  Pneumococcal polysaccharide. You need 1 to 2 doses if you smoke cigarettes or if you have certain chronic medical conditions. You need 1 dose at age 65 (or older) if you have never been vaccinated.  Tetanus, diphtheria, pertussis (Tdap, Td). Get 1 dose of Tdap vaccine if you are younger than age 65, are over 65 and have contact with an infant, are a healthcare worker, are pregnant, or simply want to be protected from whooping cough. After that, you need a Td   booster dose every 10 years. Consult your caregiver if you have not had at least 3 tetanus and diphtheria-containing shots sometime in your life or have a deep or dirty wound.  HPV. You need this vaccine if you are a woman age 26 or younger. The vaccine is given in 3 doses over 6 months.  Measles, mumps, rubella (MMR). You need at least 1 dose of MMR if you were born in 1957 or later. You may also need a second dose.  Meningococcal. If you are age 19 to 21 and a first-year college student living in a residence hall, or have one of several medical conditions, you need to get vaccinated against meningococcal disease. You may also need additional booster doses.  Zoster (shingles). If you are age 60 or older, you should get this vaccine.  Varicella (chickenpox). If you have never had chickenpox or you were vaccinated but received only 1 dose, talk to your caregiver to find out if you need this vaccine.  Hepatitis A. You need this vaccine if you have a specific risk factor for hepatitis A virus infection or you simply wish to be protected from this disease. The vaccine is usually given as 2 doses, 6 to 18 months apart.  Hepatitis B. You need this vaccine if you have a specific risk factor for hepatitis B virus infection or you simply wish to be protected from this disease. The vaccine is given in 3 doses, usually over 6 months. Preventive Services / Frequency Ages 19 to 39  Blood  pressure check.** / Every 1 to 2 years.  Lipid and cholesterol check.** / Every 5 years beginning at age 20.  Clinical breast exam.** / Every 3 years for women in their 20s and 30s.  Pap test.** / Every 2 years from ages 21 through 29. Every 3 years starting at age 30 through age 65 or 70 with a history of 3 consecutive normal Pap tests.  HPV screening.** / Every 3 years from ages 30 through ages 65 to 70 with a history of 3 consecutive normal Pap tests.  Hepatitis C blood test.** / For any individual with known risks for hepatitis C.  Skin self-exam. / Monthly.  Influenza immunization.** / Every year.  Pneumococcal polysaccharide immunization.** / 1 to 2 doses if you smoke cigarettes or if you have certain chronic medical conditions.  Tetanus, diphtheria, pertussis (Tdap, Td) immunization. / A one-time dose of Tdap vaccine. After that, you need a Td booster dose every 10 years.  HPV immunization. / 3 doses over 6 months, if you are 26 and younger.  Measles, mumps, rubella (MMR) immunization. / You need at least 1 dose of MMR if you were born in 1957 or later. You may also need a second dose.  Meningococcal immunization. / 1 dose if you are age 19 to 21 and a first-year college student living in a residence hall, or have one of several medical conditions, you need to get vaccinated against meningococcal disease. You may also need additional booster doses.  Varicella immunization.** / Consult your caregiver.  Hepatitis A immunization.** / Consult your caregiver. 2 doses, 6 to 18 months apart.  Hepatitis B immunization.** / Consult your caregiver. 3 doses usually over 6 months. Ages 40 to 64  Blood pressure check.** / Every 1 to 2 years.  Lipid and cholesterol check.** / Every 5 years beginning at age 20.  Clinical breast exam.** / Every year after age 40.  Mammogram.** / Every year beginning at age 40   and continuing for as long as you are in good health. Consult with your  caregiver.  Pap test.** / Every 3 years starting at age 30 through age 65 or 70 with a history of 3 consecutive normal Pap tests.  HPV screening.** / Every 3 years from ages 30 through ages 65 to 70 with a history of 3 consecutive normal Pap tests.  Fecal occult blood test (FOBT) of stool. / Every year beginning at age 50 and continuing until age 75. You may not need to do this test if you get a colonoscopy every 10 years.  Flexible sigmoidoscopy or colonoscopy.** / Every 5 years for a flexible sigmoidoscopy or every 10 years for a colonoscopy beginning at age 50 and continuing until age 75.  Hepatitis C blood test.** / For all people born from 1945 through 1965 and any individual with known risks for hepatitis C.  Skin self-exam. / Monthly.  Influenza immunization.** / Every year.  Pneumococcal polysaccharide immunization.** / 1 to 2 doses if you smoke cigarettes or if you have certain chronic medical conditions.  Tetanus, diphtheria, pertussis (Tdap, Td) immunization.** / A one-time dose of Tdap vaccine. After that, you need a Td booster dose every 10 years.  Measles, mumps, rubella (MMR) immunization. / You need at least 1 dose of MMR if you were born in 1957 or later. You may also need a second dose.  Varicella immunization.** / Consult your caregiver.  Meningococcal immunization.** / Consult your caregiver.  Hepatitis A immunization.** / Consult your caregiver. 2 doses, 6 to 18 months apart.  Hepatitis B immunization.** / Consult your caregiver. 3 doses, usually over 6 months. Ages 65 and over  Blood pressure check.** / Every 1 to 2 years.  Lipid and cholesterol check.** / Every 5 years beginning at age 20.  Clinical breast exam.** / Every year after age 40.  Mammogram.** / Every year beginning at age 40 and continuing for as long as you are in good health. Consult with your caregiver.  Pap test.** / Every 3 years starting at age 30 through age 65 or 70 with a 3  consecutive normal Pap tests. Testing can be stopped between 65 and 70 with 3 consecutive normal Pap tests and no abnormal Pap or HPV tests in the past 10 years.  HPV screening.** / Every 3 years from ages 30 through ages 65 or 70 with a history of 3 consecutive normal Pap tests. Testing can be stopped between 65 and 70 with 3 consecutive normal Pap tests and no abnormal Pap or HPV tests in the past 10 years.  Fecal occult blood test (FOBT) of stool. / Every year beginning at age 50 and continuing until age 75. You may not need to do this test if you get a colonoscopy every 10 years.  Flexible sigmoidoscopy or colonoscopy.** / Every 5 years for a flexible sigmoidoscopy or every 10 years for a colonoscopy beginning at age 50 and continuing until age 75.  Hepatitis C blood test.** / For all people born from 1945 through 1965 and any individual with known risks for hepatitis C.  Osteoporosis screening.** / A one-time screening for women ages 65 and over and women at risk for fractures or osteoporosis.  Skin self-exam. / Monthly.  Influenza immunization.** / Every year.  Pneumococcal polysaccharide immunization.** / 1 dose at age 65 (or older) if you have never been vaccinated.  Tetanus, diphtheria, pertussis (Tdap, Td) immunization. / A one-time dose of Tdap vaccine if you are over   65 and have contact with an infant, are a healthcare worker, or simply want to be protected from whooping cough. After that, you need a Td booster dose every 10 years.  Varicella immunization.** / Consult your caregiver.  Meningococcal immunization.** / Consult your caregiver.  Hepatitis A immunization.** / Consult your caregiver. 2 doses, 6 to 18 months apart.  Hepatitis B immunization.** / Check with your caregiver. 3 doses, usually over 6 months. ** Family history and personal history of risk and conditions may change your caregiver's recommendations. Document Released: 02/15/2001 Document Revised: 03/14/2011  Document Reviewed: 05/17/2010 [employer] Patient Information 2014 [employer].  

## 2012-12-03 DIAGNOSIS — Z124 Encounter for screening for malignant neoplasm of cervix: Secondary | ICD-10-CM | POA: Insufficient documentation

## 2012-12-03 DIAGNOSIS — Z0000 Encounter for general adult medical examination without abnormal findings: Secondary | ICD-10-CM | POA: Insufficient documentation

## 2012-12-03 NOTE — Assessment & Plan Note (Signed)
Normal exam; we discussed preventative issues for the patient's sex and age.   

## 2012-12-03 NOTE — Assessment & Plan Note (Signed)
A pap was done without difficulty.  We're checking for high risk HPV with reflex to 16/18 if positive.   

## 2013-01-05 ENCOUNTER — Other Ambulatory Visit: Payer: Self-pay | Admitting: Family Medicine

## 2013-01-07 ENCOUNTER — Other Ambulatory Visit: Payer: Self-pay | Admitting: Family Medicine

## 2013-01-08 ENCOUNTER — Ambulatory Visit: Payer: BC Managed Care – PPO | Admitting: Family Medicine

## 2013-01-10 ENCOUNTER — Ambulatory Visit (INDEPENDENT_AMBULATORY_CARE_PROVIDER_SITE_OTHER): Payer: No Typology Code available for payment source | Admitting: Family Medicine

## 2013-01-10 ENCOUNTER — Encounter: Payer: Self-pay | Admitting: Family Medicine

## 2013-01-10 VITALS — BP 144/73 | HR 62 | Resp 16 | Wt 292.0 lb

## 2013-01-10 DIAGNOSIS — R05 Cough: Secondary | ICD-10-CM

## 2013-01-10 DIAGNOSIS — R059 Cough, unspecified: Secondary | ICD-10-CM

## 2013-01-10 DIAGNOSIS — M25529 Pain in unspecified elbow: Secondary | ICD-10-CM

## 2013-01-10 MED ORDER — CYCLOBENZAPRINE HCL 5 MG PO TABS: 5.0000 mg | ORAL_TABLET | Freq: Every day | ORAL | Status: AC

## 2013-01-10 MED ORDER — METAXALONE 800 MG PO TABS: 800.0000 mg | ORAL_TABLET | Freq: Three times a day (TID) | ORAL | Status: AC

## 2013-01-10 MED ORDER — HYDROCODONE-HOMATROPINE 5-1.5 MG/5ML PO SYRP: ORAL_SOLUTION | ORAL | Status: DC

## 2013-01-10 MED ORDER — METHYLPREDNISOLONE SODIUM SUCC 125 MG IJ SOLR
125.0000 mg | Freq: Once | INTRAMUSCULAR | Status: AC
  Administered 2013-01-10: 125 mg via INTRAMUSCULAR

## 2013-01-10 MED ORDER — BENZONATATE 200 MG PO CAPS: 200.0000 mg | ORAL_CAPSULE | Freq: Three times a day (TID) | ORAL | Status: DC | PRN

## 2013-01-10 MED ORDER — KETOROLAC TROMETHAMINE 60 MG/2ML IM SOLN
60.0000 mg | Freq: Once | INTRAMUSCULAR | Status: AC
  Administered 2013-01-10: 60 mg via INTRAMUSCULAR

## 2013-01-10 MED ORDER — NABUMETONE 750 MG PO TABS: 750.0000 mg | ORAL_TABLET | Freq: Two times a day (BID) | ORAL | Status: DC | PRN

## 2013-01-10 NOTE — Progress Notes (Signed)
Subjective:    Patient ID: Mindy Wallace, female    DOB: 09/20/1965, 48 y.o.   MRN: [id number]  Mindy Wallace is here today to discuss the conditions listed below:   Arm Pain  The incident occurred 5 to 7 days ago. There was no injury mechanism. The pain is present in the left shoulder, left forearm and upper left arm. The quality of the pain is described as aching. The pain is at a severity of 10/10. The pain is severe. The pain has been constant since the incident. Associated symptoms include tingling. Pertinent negatives include no chest pain. She has tried NSAIDs (Percocet (old prescription) ) for the symptoms. The treatment provided no relief.  Cough This is a new problem. The current episode started in the past 7 days. The cough is productive of sputum. Pertinent negatives include no chest pain, chills, fever or shortness of breath. The symptoms are aggravated by lying down. She has tried nothing for the symptoms.    Review of Systems  Constitutional: Negative for fever, chills, diaphoresis and unexpected weight change.  Respiratory: Positive for cough. Negative for shortness of breath.   Cardiovascular: Negative for chest pain, palpitations and leg swelling.  Musculoskeletal: Positive for neck pain.       Left shoulder/arm pain.    Neurological: Positive for tingling.  Psychiatric/Behavioral: Positive for sleep disturbance.       Objective:   Physical Exam  Vitals reviewed. Constitutional: She appears well-nourished. No distress.  HENT:  Head: Normocephalic.  Mouth/Throat: No oropharyngeal exudate.  Eyes: Conjunctivae are normal. Right eye exhibits no discharge. Left eye exhibits no discharge.  Neck: Neck supple.  Cardiovascular: Normal rate, regular rhythm and normal heart sounds.  Exam reveals no gallop and no friction rub.   No murmur heard. Pulmonary/Chest: Effort normal and breath sounds normal. She has no wheezes. She exhibits no tenderness.  Musculoskeletal: She  exhibits tenderness (Neck/Shoulder/Left Arm ). She exhibits no edema.  Lymphadenopathy:    She has no cervical adenopathy.  Neurological: She is alert.  Skin: Skin is warm and dry. No rash noted.  Psychiatric: She has a normal mood and affect.       Assessment & Plan:    Aden was seen today for arm pain and cough.    Diagnoses and associated orders for this visit:  Pain in joint, upper arm Comments: She received injections of Solu-Medrol and Toradol.  If she does not improve, she is going to follow up with Dr. Luisa Dago vs Dr. Christell Faith.  If she does not go there then we may send her for some imaging and PT.  She'll let us know how she is doing.   - metaxalone (SKELAXIN) 800 MG tablet; Take 1 tablet (800 mg total) by mouth 3 (three) times daily. - cyclobenzaprine (FLEXERIL) 5 MG tablet; Take 1 tablet (5 mg total) by mouth at bedtime. - nabumetone (RELAFEN) 750 MG tablet; Take 1 tablet (750 mg total) by mouth 2 (two) times daily as needed for moderate pain. - methylPREDNISolone sodium succinate (SOLU-MEDROL) 125 mg/2 mL injection 125 mg; Inject 2 mLs (125 mg total) into the muscle once. - ketorolac (TORADOL) injection 60 mg; Inject 2 mLs (60 mg total) into the muscle once.  Cough Comments: She was given medications for her cough.  - benzonatate (TESSALON) 200 MG capsule; Take 1 capsule (200 mg total) by mouth 3 (three) times daily as needed for cough. - HYDROcodone-homatropine (HYCODAN) 5-1.5 MG/5ML syrup; Take 1 -2 teaspoons at night for cough -  methylPREDNISolone sodium succinate (SOLU-MEDROL) 125 mg/2 mL injection 125 mg; Inject 2 mLs (125 mg total) into the muscle once.

## 2013-01-12 NOTE — Patient Instructions (Signed)
1)  Neck/Arm Pain - Try the medications prescribed.  F/U with a chiropractor as we discussed or with us if your problem not does not resolve.   2)  Cough- Take meds as directed.

## 2013-05-09 ENCOUNTER — Other Ambulatory Visit: Payer: Self-pay | Admitting: Family Medicine

## 2013-05-09 DIAGNOSIS — I10 Essential (primary) hypertension: Secondary | ICD-10-CM

## 2013-05-09 MED ORDER — AMLODIPINE-OLMESARTAN 10-40 MG PO TABS: 1.0000 | ORAL_TABLET | Freq: Every day | ORAL | Status: AC

## 2013-05-17 ENCOUNTER — Other Ambulatory Visit: Payer: Self-pay | Admitting: Family Medicine

## 2013-05-31 ENCOUNTER — Encounter: Payer: No Typology Code available for payment source | Admitting: Family Medicine

## 2013-06-06 ENCOUNTER — Encounter: Payer: No Typology Code available for payment source | Admitting: Family Medicine

## 2013-06-06 ENCOUNTER — Ambulatory Visit (INDEPENDENT_AMBULATORY_CARE_PROVIDER_SITE_OTHER): Payer: No Typology Code available for payment source | Admitting: Family Medicine

## 2013-06-06 ENCOUNTER — Encounter: Payer: Self-pay | Admitting: Family Medicine

## 2013-06-06 VITALS — BP 124/89 | HR 80 | Resp 16 | Ht 66.0 in | Wt 288.0 lb

## 2013-06-06 DIAGNOSIS — I10 Essential (primary) hypertension: Secondary | ICD-10-CM

## 2013-06-06 DIAGNOSIS — N611 Abscess of the breast and nipple: Secondary | ICD-10-CM

## 2013-06-06 DIAGNOSIS — N61 Mastitis without abscess: Secondary | ICD-10-CM

## 2013-06-06 DIAGNOSIS — E785 Hyperlipidemia, unspecified: Secondary | ICD-10-CM

## 2013-06-06 MED ORDER — AMLODIPINE BESYLATE 10 MG PO TABS: 10.0000 mg | ORAL_TABLET | Freq: Every day | ORAL | Status: AC

## 2013-06-06 MED ORDER — LOSARTAN POTASSIUM-HCTZ 100-25 MG PO TABS: 1.0000 | ORAL_TABLET | Freq: Every day | ORAL | Status: AC

## 2013-06-06 MED ORDER — DOXYCYCLINE HYCLATE 100 MG PO CAPS
100.0000 mg | ORAL_CAPSULE | Freq: Two times a day (BID) | ORAL | Status: AC
Start: 2013-06-06 — End: ?

## 2013-06-06 MED ORDER — SIMVASTATIN 80 MG PO TABS: 80.0000 mg | ORAL_TABLET | Freq: Every evening | ORAL | Status: AC

## 2013-06-06 MED ORDER — CARVEDILOL 12.5 MG PO TABS: 12.5000 mg | ORAL_TABLET | Freq: Two times a day (BID) | ORAL | Status: AC

## 2013-06-06 NOTE — Progress Notes (Signed)
Subjective:    Patient ID: Mindy Wallace, female    DOB: Apr 13, 1965, 48 y.o.   MRN: [id number]  HPI  Shadora is here today to discuss her blood pressure medication. She can not afford the Azor and wants a more affordable medicaton.  She also needs a refill for her simvastatin.     Review of Systems  Constitutional: Negative for activity change, appetite change and fatigue.  Cardiovascular: Negative for chest pain, palpitations and leg swelling.  Skin: Positive for wound. Rash: Abscess on right breast   All other systems reviewed and are negative.    Past Medical History  Diagnosis Date  . Hypertension   . Hyperlipemia   . Tubal pregnancy   . Plantar fasciitis   . Irregular periods   . PCOS (polycystic ovarian syndrome)   . Colon polyps   . Morbid obesity      History reviewed. No pertinent past surgical history.   History   Social History Narrative   Marital Status: Married Librarian, academic)   Children:  G3 P1021 (2 Tubal Pregnancy)   Pets:  None    Living Situation: Lives with spouse   Occupation: Animal nutritionist)   Education:  Engineer, agricultural    Tobacco Use/Exposure: 5-6 cigarettes/day    Alcohol Use:  Occasional   Drug Use:  None   Diet:  Regular   Exercise:  Limited    Hobbies: Hair, Taking care of people              Family History  Problem Relation Age of Onset  . Diabetes Mother   . Hypertension Mother   . Diabetes Father   . Cancer Paternal Aunt     Breast Cancer  . Diabetes Maternal Grandmother   . Diabetes Paternal Grandmother   . Alzheimer's disease Paternal Grandmother   . Cancer Paternal Aunt     Breast Cancer     Current Outpatient Prescriptions on File Prior to Visit  Medication Sig Dispense Refill  . amLODipine-olmesartan (AZOR) 10-40 MG per tablet Take 1 tablet by mouth daily.  30 tablet  11  . Cholecalciferol (VITAMIN D) 2000 UNITS CAPS Take by mouth.      . cyclobenzaprine (FLEXERIL) 5 MG tablet  Take 1 tablet (5 mg total) by mouth at bedtime.  30 tablet  1  . furosemide (LASIX) 40 MG tablet Take 2 tablets (80 mg total) by mouth daily.  90 tablet  1  . metaxalone (SKELAXIN) 800 MG tablet Take 1 tablet (800 mg total) by mouth 3 (three) times daily.  90 tablet  1   No current facility-administered medications on file prior to visit.     No Known Allergies   Immunization History  Administered Date(s) Administered  . Tdap 01/19/2007       Objective:   Physical Exam  Nursing note and vitals reviewed. Constitutional: She appears well-nourished.  Cardiovascular: Normal rate and regular rhythm.   Pulmonary/Chest: Effort normal.    Musculoskeletal: Normal range of motion.  Skin: Skin is warm and dry.  Psychiatric: She has a normal mood and affect. Her behavior is normal. Judgment and thought content normal.      Assessment & Plan:    Elysia was seen today for medication management.  Diagnoses and associated orders for this visit:  Essential hypertension, benign Comments: She will try a combination of Hyzaar, amlodipine and carvedilol.   - losartan-hydrochlorothiazide (HYZAAR) 100-25 MG per tablet; Take 1 tablet by mouth daily. - amLODipine (  NORVASC) 10 MG tablet; Take 1 tablet (10 mg total) by mouth daily. - carvedilol (COREG) 12.5 MG tablet; Take 1 tablet (12.5 mg total) by mouth 2 (two) times daily with a meal.  Other and unspecified hyperlipidemia Comments: She was given a prescription for simvastatin.  - simvastatin (ZOCOR) 80 MG tablet; Take 1 tablet (80 mg total) by mouth every evening.  Abscess of breast Comments: The abscess has already ruptured and has drained so there was nothing to culture. She is to take the Doxycycline and apply Bactroban. She also was reminded to get a mammogram.  She is to also let us know if this skin lesion does not heal.   - doxycycline (VIBRAMYCIN) 100 MG capsule; Take 1 capsule (100 mg total) by mouth 2 (two) times  daily.   TIME SPENT "FACE TO FACE" WITH PATIENT -  30 MINS

## 2013-06-06 NOTE — Patient Instructions (Signed)
1)  BP - Try the combination of losartan HCT 100/25 plus amlodipine 10 mg and take the Lasix 1 per day if needed for extra swelling.  2)  Breast - Take the doxycycline and apply the Bactroban and get your mammogram.     Managing Your High Blood Pressure Blood pressure is a measurement of how forceful your blood is pressing against the walls of the arteries. Arteries are muscular tubes within the circulatory system. Blood pressure does not stay the same. Blood pressure rises when you are active, excited, or nervous; and it lowers during sleep and relaxation. If the numbers measuring your blood pressure stay above normal most of the time, you are at risk for health problems. High blood pressure (hypertension) is a long-term (chronic) condition in which blood pressure is elevated. A blood pressure reading is recorded as two numbers, such as 120 over 80 (or 120/80). The first, higher number is called the systolic pressure. It is a measure of the pressure in your arteries as the heart beats. The second, lower number is called the diastolic pressure. It is a measure of the pressure in your arteries as the heart relaxes between beats.  Keeping your blood pressure in a normal range is important to your overall health and prevention of health problems, such as heart disease and stroke. When your blood pressure is uncontrolled, your heart has to work harder than normal. High blood pressure is a very common condition in adults because blood pressure tends to rise with age. Men and women are equally likely to have hypertension but at different times in life. Before age 41, men are more likely to have hypertension. After 50 years of age, women are more likely to have it. Hypertension is especially common in African Americans. This condition often has no signs or symptoms. The cause of the condition is usually not known. Your caregiver can help you come up with a plan to keep your blood pressure in a normal, healthy  range. BLOOD PRESSURE STAGES Blood pressure is classified into four stages: normal, prehypertension, stage 1, and stage 2. Your blood pressure reading will be used to determine what type of treatment, if any, is necessary. Appropriate treatment options are tied to these four stages:  Normal  Systolic pressure (mm Hg): below 120.  Diastolic pressure (mm Hg): below 80. Prehypertension  Systolic pressure (mm Hg): 120 to 139.  Diastolic pressure (mm Hg): 80 to 89. Stage1  Systolic pressure (mm Hg): 140 to 159.  Diastolic pressure (mm Hg): 90 to 99. Stage2  Systolic pressure (mm Hg): 160 or above.  Diastolic pressure (mm Hg): 100 or above. RISKS RELATED TO HIGH BLOOD PRESSURE Managing your blood pressure is an important responsibility. Uncontrolled high blood pressure can lead to:  A heart attack.  A stroke.  A weakened blood vessel (aneurysm).  Heart failure.  Kidney damage.  Eye damage.  Metabolic syndrome.  Memory and concentration problems. HOW TO MANAGE YOUR BLOOD PRESSURE Blood pressure can be managed effectively with lifestyle changes and medicines (if needed). Your caregiver will help you come up with a plan to bring your blood pressure within a normal range. Your plan should include the following: Education  Read all information provided by your caregivers about how to control blood pressure.  Educate yourself on the latest guidelines and treatment recommendations. New research is always being done to further define the risks and treatments for high blood pressure. Lifestylechanges  Control your weight.  Avoid smoking.  Stay physically  active.  Reduce the amount of salt in your diet.  Reduce stress.  Control any chronic conditions, such as high cholesterol or diabetes.  Reduce your alcohol intake. Medicines  Several medicines (antihypertensive medicines) are available, if needed, to bring blood pressure within a normal  range. Communication  Review all the medicines you take with your caregiver because there may be side effects or interactions.  Talk with your caregiver about your diet, exercise habits, and other lifestyle factors that may be contributing to high blood pressure.  See your caregiver regularly. Your caregiver can help you create and adjust your plan for managing high blood pressure. RECOMMENDATIONS FOR TREATMENT AND FOLLOW-UP  The following recommendations are based on current guidelines for managing high blood pressure in nonpregnant adults. Use these recommendations to identify the proper follow-up period or treatment option based on your blood pressure reading. You can discuss these options with your caregiver.  Systolic pressure of 120 to 139 or diastolic pressure of 80 to 89: Follow up with your caregiver as directed.  Systolic pressure of 140 to 160 or diastolic pressure of 90 to 100: Follow up with your caregiver within 2 months.  Systolic pressure above 160 or diastolic pressure above 100: Follow up with your caregiver within 1 month.  Systolic pressure above 180 or diastolic pressure above 110: Consider antihypertensive therapy; follow up with your caregiver within 1 week.  Systolic pressure above 200 or diastolic pressure above 120: Begin antihypertensive therapy; follow up with your caregiver within 1 week. Document Released: 09/14/2011 Document Reviewed: 09/14/2011 [facility] Patient Information 2014 [place], [place].

## 2013-08-19 ENCOUNTER — Other Ambulatory Visit: Payer: Self-pay | Admitting: Family Medicine

## 2013-12-28 IMAGING — CT CT HEAD W/O CM
1 series · 16 of 30 positions shown, 20 images · non-contrast
Comparison: None.

CLINICAL DATA: Intermittent left arm numbness.

CT HEAD WITHOUT CONTRAST
TECHNIQUE: Contiguous axial images were obtained from the base of
the skull through the vertex without contrast.

[Series 2: head 4.8 h37s · axial · 0.46mm/px · z∈[+838,+990]mm · 16 of 36 slices shown, 20 images]
[im 2/36  brain]
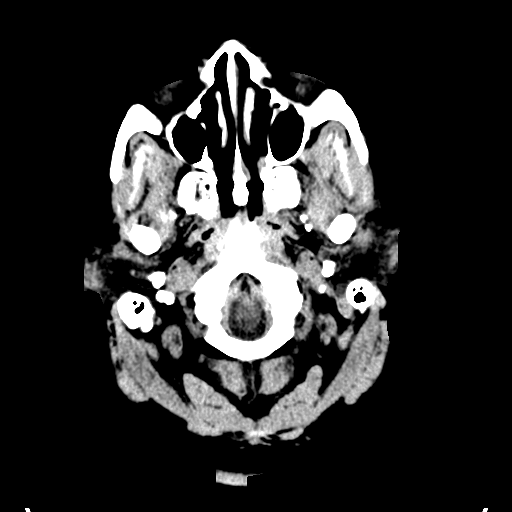
[im 2/36  bone]
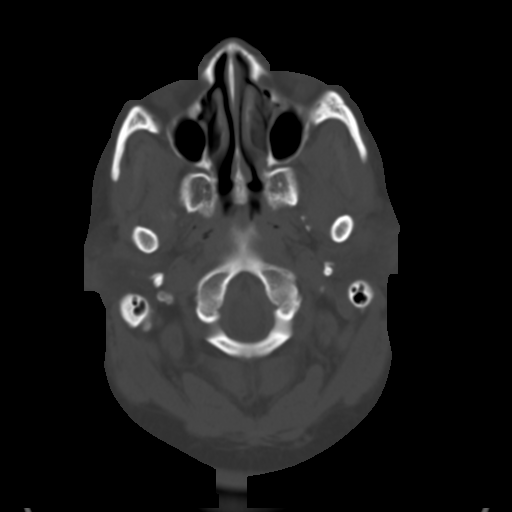
[im 4/36  brain]
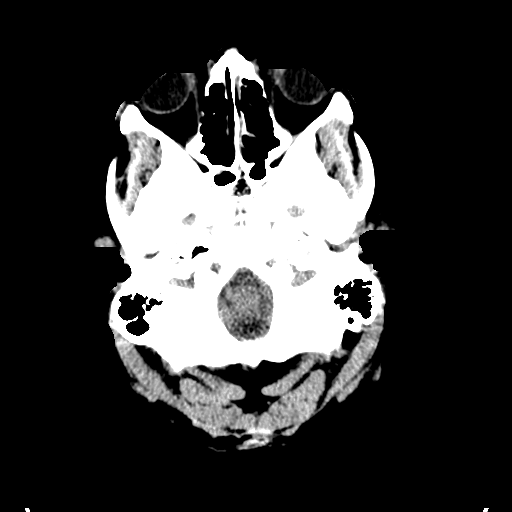
[im 7/36  brain]
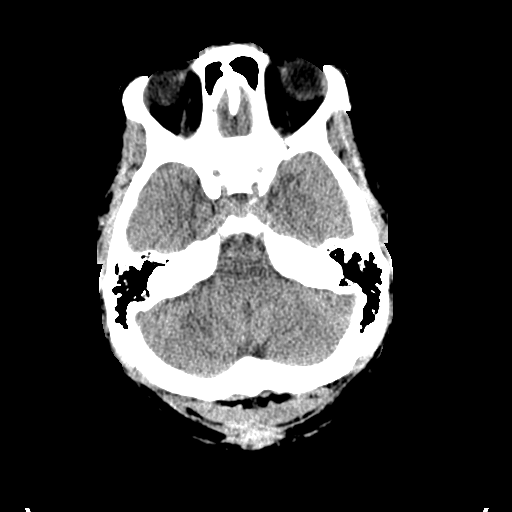
[im 9/36  brain]
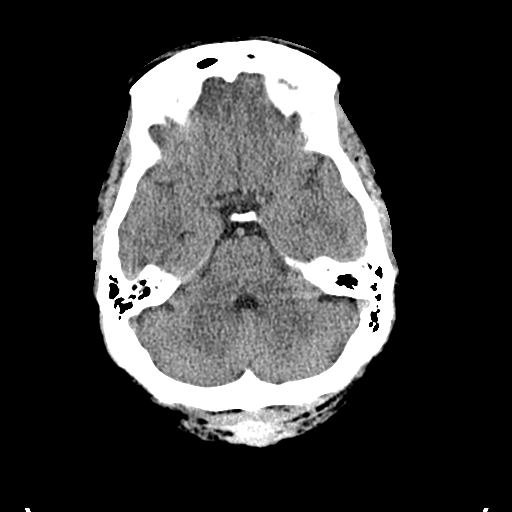
[im 10/36  brain]
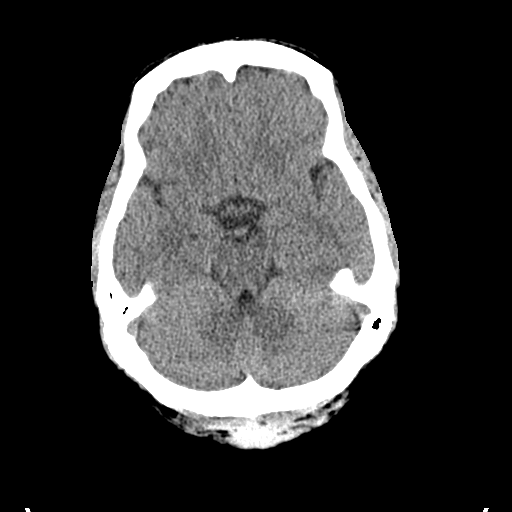
[im 10/36  bone]
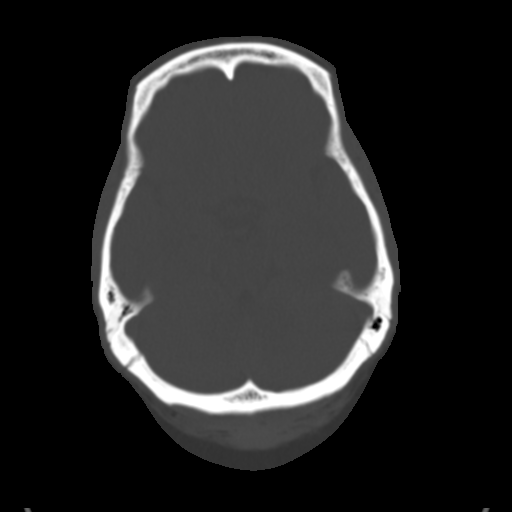
[im 13/36  brain]
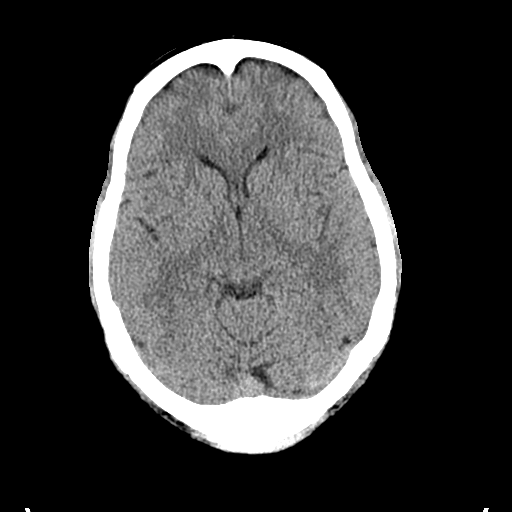
[im 15/36  brain]
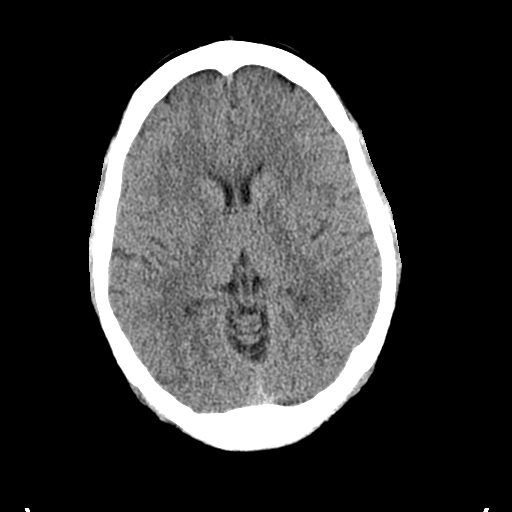
[im 17/36  brain]
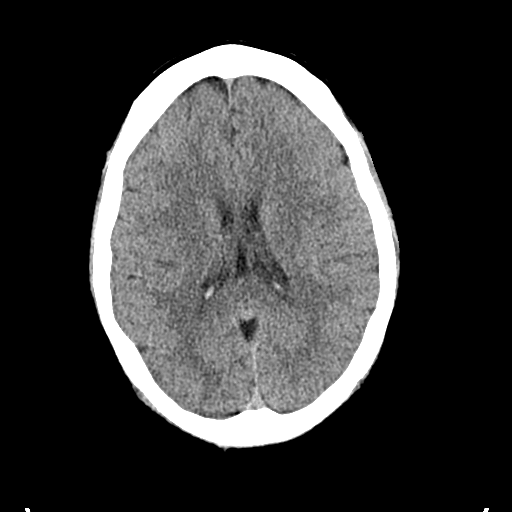
[im 19/36  brain]
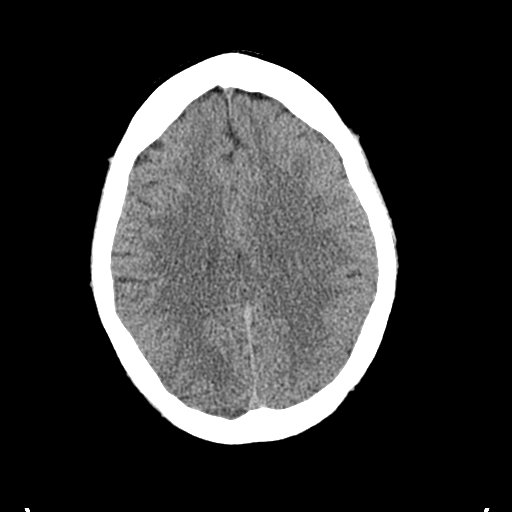
[im 19/36  bone]
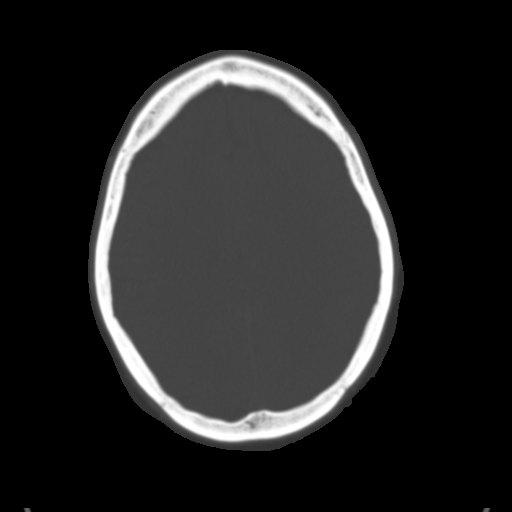
[im 21/36  brain]
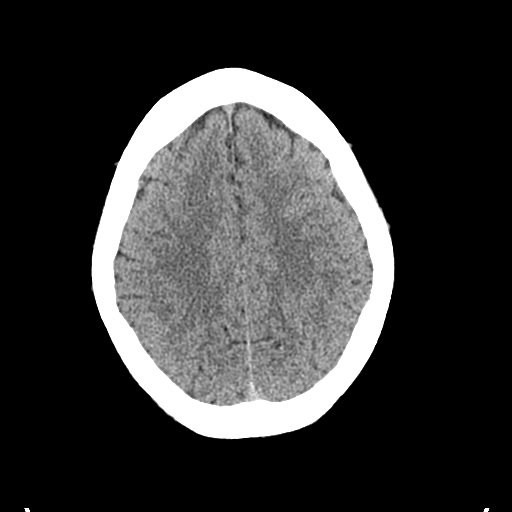
[im 23/36  brain]
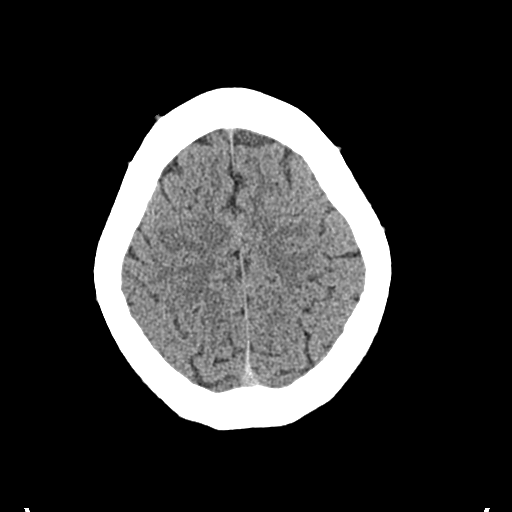
[im 26/36  brain]
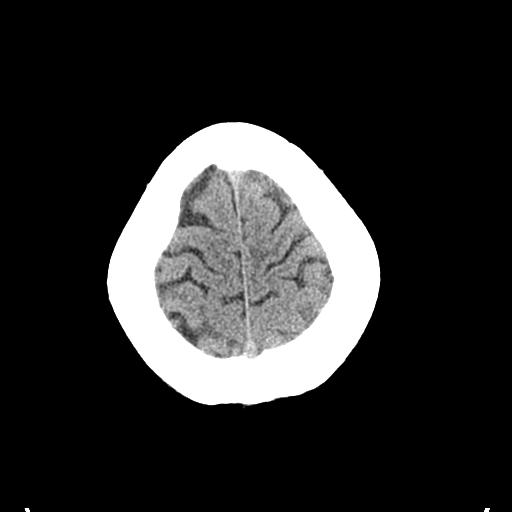
[im 27/36  brain]
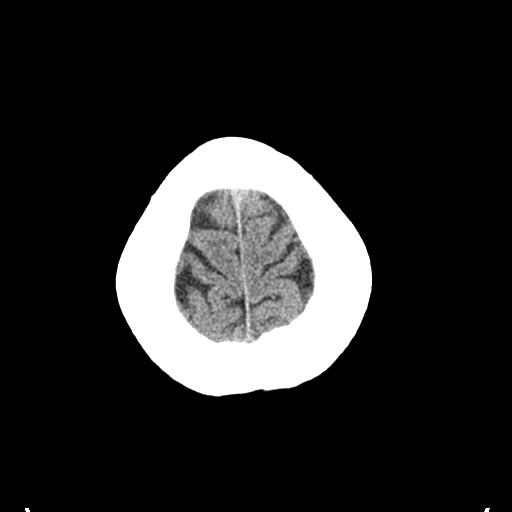
[im 27/36  bone]
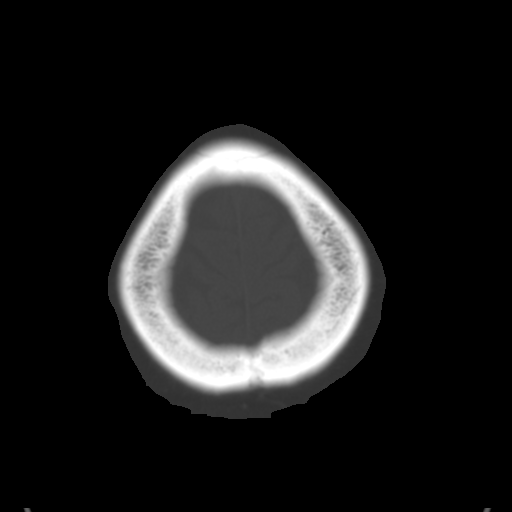
[im 29/36  brain]
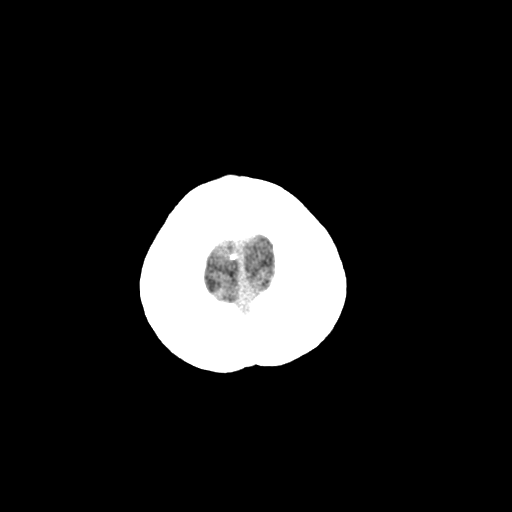
[im 32/36  brain]
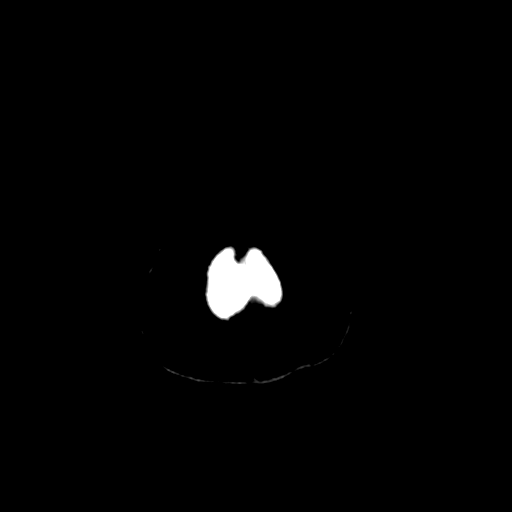
[im 34/36  brain]
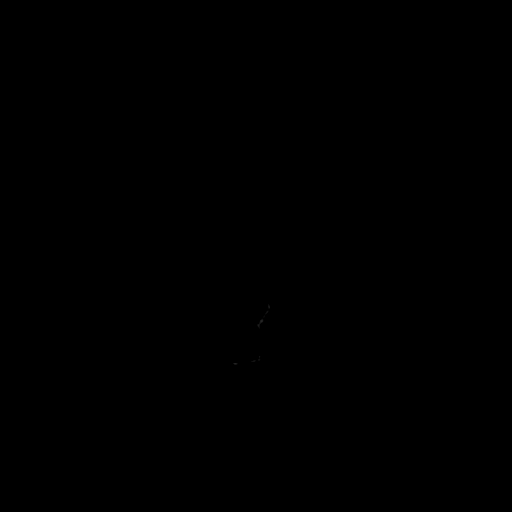

[16 of 30 positions shown; findings below may reference images not displayed]

FINDINGS: The brain demonstrates no evidence of hemorrhage,
infarction, edema, mass effect, extra-axial fluid collection,
hydrocephalus or mass lesion.  The skull is unremarkable.
IMPRESSION: Normal head CT.
